# Patient Record
Sex: Female | Born: 1980 | Race: White | Hispanic: No | Marital: Single | Smoking: Current some day smoker
Health system: Southern US, Community
[De-identification: ages and names within clinical notes are randomized; demographics above are authoritative.]

## PROBLEM LIST (undated history)

## (undated) DIAGNOSIS — Z973 Presence of spectacles and contact lenses: Secondary | ICD-10-CM

## (undated) DIAGNOSIS — T753XXA Motion sickness, initial encounter: Secondary | ICD-10-CM

## (undated) HISTORY — PX: WISDOM TOOTH EXTRACTION: SHX21

---

## 2019-08-29 ENCOUNTER — Ambulatory Visit
Admission: EM | Admit: 2019-08-29 | Discharge: 2019-08-29 | Disposition: A | Discharge status: Home / Self Care | Payer: Self-pay | Attending: Emergency Medicine | Admitting: Emergency Medicine

## 2019-08-29 ENCOUNTER — Other Ambulatory Visit: Payer: Self-pay

## 2019-08-29 DIAGNOSIS — R634 Abnormal weight loss: Secondary | ICD-10-CM

## 2019-08-29 DIAGNOSIS — R112 Nausea with vomiting, unspecified: Secondary | ICD-10-CM

## 2019-08-29 LAB — URINALYSIS, COMPLETE (UACMP) WITH MICROSCOPIC
Glucose, UA: NEGATIVE mg/dL
Ketones, ur: 40 mg/dL — AB
Leukocytes,Ua: NEGATIVE
Nitrite: NEGATIVE
Protein, ur: NEGATIVE mg/dL
Specific Gravity, Urine: >1.03 — ABNORMAL HIGH (ref 1.005–1.030)
pH: 5.5 (ref 5.0–8.0)

## 2019-08-29 LAB — CBC WITH DIFFERENTIAL/PLATELET
Abs Immature Granulocytes: 0.04 10*3/uL (ref 0.00–0.07)
Basophils Absolute: 0.1 10*3/uL (ref 0.0–0.1)
Basophils Relative: 1 %
Eosinophils Absolute: 0.1 10*3/uL (ref 0.0–0.5)
Eosinophils Relative: 1 %
HCT: 41.9 % (ref 36.0–46.0)
Hemoglobin: 14.5 g/dL (ref 12.0–15.0)
Immature Granulocytes: 0 %
Lymphocytes Relative: 11 %
Lymphs Abs: 1.4 10*3/uL (ref 0.7–4.0)
MCH: 30.7 pg (ref 26.0–34.0)
MCHC: 34.6 g/dL (ref 30.0–36.0)
MCV: 88.6 fL (ref 80.0–100.0)
Monocytes Absolute: 0.6 10*3/uL (ref 0.1–1.0)
Monocytes Relative: 5 %
Neutro Abs: 10.2 10*3/uL — ABNORMAL HIGH (ref 1.7–7.7)
Neutrophils Relative %: 82 %
Platelets: 267 10*3/uL (ref 150–400)
RBC: 4.73 MIL/uL (ref 3.87–5.11)
RDW: 12.5 % (ref 11.5–15.5)
WBC: 12.3 10*3/uL — ABNORMAL HIGH (ref 4.0–10.5)
nRBC: 0 % (ref 0.0–0.2)

## 2019-08-29 LAB — COMPREHENSIVE METABOLIC PANEL
ALT: 23 U/L (ref 0–44)
AST: 27 U/L (ref 15–41)
Albumin: 4.6 g/dL (ref 3.5–5.0)
Alkaline Phosphatase: 57 U/L (ref 38–126)
Anion gap: 9 (ref 5–15)
BUN: 8 mg/dL (ref 6–20)
CO2: 25 mmol/L (ref 22–32)
Calcium: 9.3 mg/dL (ref 8.9–10.3)
Chloride: 103 mmol/L (ref 98–111)
Creatinine, Ser: 0.6 mg/dL (ref 0.44–1.00)
GFR calc Af Amer: >60 mL/min (ref >60)
GFR calc non Af Amer: >60 mL/min (ref >60)
Glucose, Bld: 102 mg/dL — ABNORMAL HIGH (ref 70–99)
Potassium: 3.3 mmol/L — ABNORMAL LOW (ref 3.5–5.1)
Sodium: 137 mmol/L (ref 135–145)
Total Bilirubin: 0.9 mg/dL (ref 0.3–1.2)
Total Protein: 7.9 g/dL (ref 6.5–8.1)

## 2019-08-29 LAB — HIV ANTIBODY (ROUTINE TESTING W REFLEX): HIV Screen 4th Generation wRfx: NONREACTIVE

## 2019-08-29 LAB — SEDIMENTATION RATE: Sed Rate: 8 mm/hr (ref 0–20)

## 2019-08-29 LAB — TSH: TSH: 1.674 u[IU]/mL (ref 0.350–4.500)

## 2019-08-29 LAB — LIPASE, BLOOD: Lipase: 28 U/L (ref 11–51)

## 2019-08-29 MED ORDER — ONDANSETRON 8 MG PO TBDP: ORAL_TABLET | ORAL | 0 refills | Status: DC

## 2019-08-29 MED ORDER — ONDANSETRON 8 MG PO TBDP
8.0000 mg | ORAL_TABLET | Freq: Once | ORAL | Status: AC
  Administered 2019-08-29: 8 mg via ORAL

## 2019-08-29 NOTE — ED Provider Notes (Addendum)
HPI  SUBJECTIVE:  Denise Carr is a 38 y.o. female who presents with 3 episodes of nonbilious nonbloody emesis starting yesterday.  She reports constant nausea.  States symptoms started shortly after lunch, which was chicken.  Nobody else got sick from lunch.  She reports left-sided abdominal/flank pain described as mild cramps before vomiting, and that resolves afterwards.  She has not had any further abdominal pain.  She states that she is unable to tolerate any p.o. whatsoever.  She tried Tylenol 250 mg, states that she took 10 pills total yesterday and is wondering if this is causing her symptoms.  No alleviating factors.  Nausea and vomiting is worse with eating, drinking, walking around.  She denies fevers, nasal congestion, sore throat, cough, shortness of breath, loss of sense of taste or smell, diarrhea, exposure to Covid.  She does not take any medications on a regular basis.  No new foods, raw or undercooked foods, questionable leftovers, contacts with similar symptoms.  Had a normal bowel movement this morning.  She denies change in urine output, urinary complaints.  Past medical history negative for diabetes, hypertension, cancer, abdominal surgeries, alcohol use, pancreatitis, gallbladder disease.  LMP: Now.  Denies the possibility of being pregnant.  She reports never having nausea and vomiting with menses before.  Family history significant for mother with celiac disease, brother and sister with gallbladder disease.  PMD: None.  She also reports a 70 pound unintentional weight loss with night sweats since June.  Reports decreased appetite.  She denies any risk factors for HIV including IV drug use, intercourse with HIV positive individual, blood transfusion.  What brings her in today is the acute nausea, vomiting.   History reviewed. No pertinent past medical history.  Past Surgical History:  Procedure Laterality Date  . NO PAST SURGERIES      Family History  Problem Relation Age  of Onset  . Celiac disease Mother   . Healthy Father     Social History   Tobacco Use  . Smoking status: Never Smoker  . Smokeless tobacco: Never Used  Substance Use Topics  . Alcohol use: Not Currently  . Drug use: Yes    Types: Marijuana    Comment: nightly    No current facility-administered medications for this encounter.  Current Outpatient Medications:  .  ondansetron (ZOFRAN ODT) 8 MG disintegrating tablet, 1/2- 1 tablet q 8 hr prn nausea, vomiting, Disp: 20 tablet, Rfl: 0  No Known Allergies   ROS  As noted in HPI.   Physical Exam  BP 120/89 (BP Location: Left Arm)   Pulse (!) 59   Temp 98.2 F (36.8 C) (Oral)   Resp 16   Ht 5' 4"  (1.626 m)   Wt 68.5 kg   LMP 08/28/2019   SpO2 100%   BMI 25.92 kg/m   Constitutional: Well developed, well nourished, no acute distress Eyes:  EOMI, conjunctiva normal bilaterally HENT: Normocephalic, atraumatic,mucus membranes moist Respiratory: Normal inspiratory effort, lungs clear bilaterally.   Cardiovascular: Normal rate regular rhythm no murmurs rubs or gallops GI: Normal appearance, soft.  Mild lower quadrant tenderness with deep palpation bilaterally.  No rebound, guarding.  Active bowel sounds.  Negative Murphy, negative McBurney. Back: No CVAT skin: No rash, skin intact Musculoskeletal: no deformities Neurologic: Alert & oriented x 3, no focal neuro deficits Psychiatric: Speech and behavior appropriate   ED Course   Medications  ondansetron (ZOFRAN-ODT) disintegrating tablet 8 mg (8 mg Oral Given 08/29/19 0854)    Orders  Placed This Encounter  Procedures  . Lipase, blood    Standing Status:   Standing    Number of Occurrences:   1  . Comprehensive metabolic panel    Standing Status:   Standing    Number of Occurrences:   1  . CBC with Differential    Standing Status:   Standing    Number of Occurrences:   1  . Sedimentation rate    Standing Status:   Standing    Number of Occurrences:   1  .  Urinalysis, Complete w Microscopic    Standing Status:   Standing    Number of Occurrences:   1  . Ambulatory referral to Gastroenterology    Referral Priority:   Urgent    Referral Type:   Consultation    Referral Reason:   Specialty Services Required    Referred to Provider:   Lucilla Lame, MD    Number of Visits Requested:   1    No results found for this or any previous visit (from the past 24 hour(s)). No results found.  ED Clinical Impression  1. Non-intractable vomiting with nausea, unspecified vomiting type   2. Unintentional weight loss      ED Assessment/Plan  Doubt Tylenol poisoning, calculated dose that she took yesterday 250 mg x 10 pills=2500 mg.  Could be food poisoning, gastritis, viral illness.  Her abdomen is benign.  Doubt perforation, pancreatitis, cholecystitis, appendicitis, obstruction.  CBC with mild leukocytosis, elevated neutrophils.  UA contaminated.  Will not send off for culture.  She has no nitrites or esterase.  She has some ketones, but she has also been unable to eat or drink since yesterday.  Small hematuria, small bilirubin.  Patient was given 8 mg of Zofran and Pedialyte.  She is tolerating p.o. prior to discharge.  States that she feels significantly better.  Her abdomen is benign.  Malignancy high in the differential with the unintentional weight loss, night sweats.  Will get basic labs eluding ESR TSH UA CMP, lipase, CBC, HIV. will call her at [phone] if these are abnormal.  Will refer to Dr. Allen Norris, GI for further work-up.  Will also provide primary care list.  ESR TSH, CMP, lipase, HIV pending.  Discussed labs, MDM, treatment plan, and plan for follow-up with patient.. patient agrees with plan.   08/31/2019 1444.  Additional labs reviewed.  Mild hypokalemia, but this is to be expected from the vomiting she reports.  Expect that this will resolve with some electrolyte containing fluids and potassium rich foods.  Sed rate, lipase, TSH normal.   HIV negative.  Discussed largely normal labs with patient.  She states that she is feeling much better.  She has an appointment with GI scheduled on 12/29.   Meds ordered this encounter  Medications  . ondansetron (ZOFRAN-ODT) disintegrating tablet 8 mg  . ondansetron (ZOFRAN ODT) 8 MG disintegrating tablet    Sig: 1/2- 1 tablet q 8 hr prn nausea, vomiting    Dispense:  20 tablet    Refill:  0    *This clinic note was created using Lobby[employer]. Therefore, there may be occasional mistakes despite careful proofreading.   ?    Melynda Ripple, MD 08/29/19 1009    Melynda Ripple, MD 08/31/19 1429

## 2019-08-29 NOTE — Discharge Instructions (Addendum)
Take the Zofran as needed for nausea and vomiting.  It May cause constipation.  Push extra electrolyte containing fluids such as Pedialyte or Gatorade until your urine is clear.  I will contact you only if your labs come back abnormal.  It may take several days for some of them to come back.  Follow-up with Dr. Allen Norris, gastroenterologist as soon as you can.  Here is a list of primary care providers who are taking new patients:  Dr. Otilio Miu, Dr. Adline Potter [address] [place] [address] [place] [address]  [phone]  [facility] [address] [place], [place] [address] [phone]  [facility]  [phone] [place], [place] [address]  Here are clinics/ other resources who will see you if you do not have insurance. Some have certain criteria that you must meet. Call them and find out what they are:  [facility]: [address] [place], [place] [address] Phone: [phone] Hours: First and Third Saturdays of each Month, 9 a.m. - 1 p.m.  [facility]: [address] [place], [place], [place] [address] Phone: [phone] Hours: Tuesday, 4 p.m. - 8 p.m. Thursday, 1 p.m. - 8 p.m. Wednesday, 9 a.m. - [facility] [address] [place], [place] [address] Phone: [phone] Pharmacy Phone Number: [phone] Dental Phone Number: [phone] [place] [facility]: [phone]  Dental Hours: Monday - Thursday, 8 a.m. - 6 p.m.  [facility] [address] [place], [place] [address] Phone: [phone] Pharmacy Phone Number: [phone] [facility] Insurance Help: [phone]  [facility] [address] [place], [place] [address] Phone: [phone] Pharmacy Phone Number: [phone] [facility] Insurance Help: [phone]  [facility] [address] [place], [place] [address] Phone: [phone] [facility] Insurance Help: [phone]   [address] [place], [place] [address] Phone: [phone]  Go to www.goodrx.com to look up your medications. This will give you a list of where you can find your prescriptions at the most affordable prices. Or ask the pharmacist what the cash price is, or if they have any other discount programs available to help make your medication more affordable. This can be less expensive than what you would pay with insurance.

## 2019-08-29 NOTE — ED Triage Notes (Signed)
Patient states that she started loosing weight in June. Patient states that she is down 70lbs but has not been trying to lose weight. Patient reports that she started her menstrual cycle yesterday and thinks this may be what is causing her symptoms today. Patient reports that she is having nausea, emesis- (twice yesterday and once this morning-- states that she has only been having liquid emesis). Patient states that she has been tired and this started with the weight loss.

## 2019-09-18 ENCOUNTER — Encounter: Payer: Self-pay | Admitting: Gastroenterology

## 2019-09-18 ENCOUNTER — Other Ambulatory Visit: Payer: Self-pay

## 2019-09-18 ENCOUNTER — Ambulatory Visit: Payer: Self-pay | Admitting: Gastroenterology

## 2019-09-18 VITALS — BP 121/71 | HR 81 | Temp 98.0°F | Ht 64.0 in | Wt 155.0 lb

## 2019-09-18 DIAGNOSIS — R634 Abnormal weight loss: Secondary | ICD-10-CM

## 2019-09-18 DIAGNOSIS — R6881 Early satiety: Secondary | ICD-10-CM

## 2019-09-18 NOTE — Progress Notes (Signed)
Gastroenterology Consultation  Referring Provider:     Melynda Ripple, MD Primary Care Physician:  Patient, No Pcp Per Primary Gastroenterologist:  Dr. Allen Norris     Reason for Consultation:     Weight loss with nausea vomiting        HPI:   Denise Carr is a 38 y.o. y/o female referred for consultation & management of weight loss with nausea vomiting by Dr. Patient, No Pcp Per.  This patient was in the emergency room back on 9 December for nausea, vomiting that had been going on for 1 day prior to going to the emergency room.  The patient had noted to the emergency room doctor that she had lost a significant amount of weight.  The patient had reported losing 70 pounds in total.  The patient was reporting left-sided abdominal pain and flank pain.  The patient's hemoglobin was normal but she had a slightly elevated white cell count of 12.3.  The patient's liver function tests were normal. The patient reports that she has some loose bowel movements during the summer but nothing recently.  She also states that her father had colon polyps when he was much older and her mother has celiac sprue.  The patient endorses the feeling of feeling full shortly after she eats.  There is no sign of any overt blood loss that she can report.  She also denies any abdominal pain.  Although she is pleased that she has lost weight she is concerned because she did not want any further weight.  History reviewed. No pertinent past medical history.  Past Surgical History:  Procedure Laterality Date  . NO PAST SURGERIES      Prior to Admission medications   Medication Sig Start Date End Date Taking? Authorizing Provider  ondansetron (ZOFRAN ODT) 8 MG disintegrating tablet 1/2- 1 tablet q 8 hr prn nausea, vomiting 08/29/19   Melynda Ripple, MD    Family History  Problem Relation Age of Onset  . Celiac disease Mother   . Healthy Father      Social History   Tobacco Use  . Smoking status: Never Smoker  .  Smokeless tobacco: Never Used  Substance Use Topics  . Alcohol use: Not Currently  . Drug use: Yes    Types: Marijuana    Comment: nightly    Allergies as of 09/18/2019  . (No Known Allergies)    Review of Systems:    All systems reviewed and negative except where noted in HPI.   Physical Exam:  BP 121/71   Pulse 81   Temp 98 F (36.7 C) (Oral)   Ht 5\' 4"  (1.626 m)   Wt 155 lb (70.3 kg)   LMP 08/28/2019   BMI 26.61 kg/m  Patient's last menstrual period was 08/28/2019. General:   Alert,  Well-developed, well-nourished, pleasant and cooperative in NAD Head:  Normocephalic and atraumatic. Eyes:  Sclera clear, no icterus.   Conjunctiva pink. Ears:  Normal auditory acuity. Neck:  Supple; no masses or thyromegaly. Lungs:  Respirations even and unlabored.  Clear throughout to auscultation.   No wheezes, crackles, or rhonchi. No acute distress. Heart:  Regular rate and rhythm; no murmurs, clicks, rubs, or gallops. Abdomen:  Normal bowel sounds.  No bruits.  Soft, non-tender and non-distended without masses, hepatosplenomegaly or hernias noted.  No guarding or rebound tenderness.  Negative Carnett sign.   Rectal:  Deferred.  Msk:  Symmetrical without gross deformities.  Good, equal movement & strength bilaterally. Pulses:  Normal pulses noted. Extremities:  No clubbing or edema.  No cyanosis. Neurologic:  Alert and oriented x3;  grossly normal neurologically. Skin:  Intact without significant lesions or rashes.  No jaundice. Lymph Nodes:  No significant cervical adenopathy. Psych:  Alert and cooperative. Normal mood and affect.  Imaging Studies: No results found.  Assessment and Plan:   [name] is a 38 y.o. y/o female who comes in today with a history of abdominal pain that she attributes to taking too much Tylenol and the pain has subsequently disappeared without any recurrence of the pain.  She also had noted that it was after taking many Tylenol over a 24-hour period.   She now reports early satiety and a 70 pound weight loss without trying.  The patient has been explained the possibility of a malabsorptive syndrome although she is not having diarrhea versus a possible neoplasm.  The patient has been recommended to undergo an EGD and colonoscopy.  The patient states that she would like to discuss it with her family and if she decides to proceed then she will contact us to set up the EGD and colonoscopy.  The patient has been explained the plan and agrees with it    Midge Minium, MD. Clementeen Graham    Note: This dictation was prepared with Dragon dictation along with smaller phrase technology. Any transcriptional errors that result from this process are unintentional.

## 2019-10-15 ENCOUNTER — Telehealth: Payer: Self-pay | Admitting: Gastroenterology

## 2019-10-15 NOTE — Telephone Encounter (Signed)
Pt is calling  She has been trying to reach  Ginger for a month now to schedule a EGD and a colonoscopy please call pt ASAP to set this up.

## 2019-10-15 NOTE — Telephone Encounter (Signed)
This is the first message I have received from this pt to schedule. LVM for pt to return my call.

## 2019-10-16 ENCOUNTER — Other Ambulatory Visit: Payer: Self-pay

## 2019-10-16 DIAGNOSIS — R6881 Early satiety: Secondary | ICD-10-CM

## 2019-10-16 DIAGNOSIS — R634 Abnormal weight loss: Secondary | ICD-10-CM

## 2019-11-07 ENCOUNTER — Other Ambulatory Visit: Payer: Self-pay

## 2019-11-07 ENCOUNTER — Encounter: Payer: Self-pay | Admitting: Gastroenterology

## 2019-11-09 ENCOUNTER — Other Ambulatory Visit: Payer: Self-pay

## 2019-11-09 ENCOUNTER — Other Ambulatory Visit
Admission: RE | Admit: 2019-11-09 | Discharge: 2019-11-09 | Disposition: A | Discharge status: Home / Self Care | Payer: Medicaid Other | Source: Ambulatory Visit | Attending: Gastroenterology | Admitting: Gastroenterology

## 2019-11-09 DIAGNOSIS — Z01812 Encounter for preprocedural laboratory examination: Secondary | ICD-10-CM | POA: Diagnosis present

## 2019-11-09 DIAGNOSIS — Z20822 Contact with and (suspected) exposure to covid-19: Secondary | ICD-10-CM | POA: Insufficient documentation

## 2019-11-09 LAB — SARS CORONAVIRUS 2 (TAT 6-24 HRS): SARS Coronavirus 2: NEGATIVE

## 2019-11-09 MED ORDER — SUPREP BOWEL PREP KIT 17.5-3.13-1.6 GM/177ML PO SOLN: 1.0000 | ORAL | 0 refills | Status: DC

## 2019-11-12 NOTE — Discharge Instructions (Signed)
General Anesthesia, Adult, Care After This sheet gives you information about how to care for yourself after your procedure. Your health care provider may also give you more specific instructions. If you have problems or questions, contact your health care provider. What can I expect after the procedure? After the procedure, the following side effects are common:  Pain or discomfort at the IV site.  Nausea.  Vomiting.  Sore throat.  Trouble concentrating.  Feeling cold or chills.  Weak or tired.  Sleepiness and fatigue.  Soreness and body aches. These side effects can affect parts of the body that were not involved in surgery. Follow these instructions at home:  For at least 24 hours after the procedure:  Have a responsible adult stay with you. It is important to have someone help care for you until you are awake and alert.  Rest as needed.  Do not: ? Participate in activities in which you could fall or become injured. ? Drive. ? Use heavy machinery. ? Drink alcohol. ? Take sleeping pills or medicines that cause drowsiness. ? Make important decisions or sign legal documents. ? Take care of children on your own. Eating and drinking  Follow any instructions from your health care provider about eating or drinking restrictions.  When you feel hungry, start by eating small amounts of foods that are soft and easy to digest (bland), such as toast. Gradually return to your regular diet.  Drink enough fluid to keep your urine pale yellow.  If you vomit, rehydrate by drinking water, juice, or clear broth. General instructions  If you have sleep apnea, surgery and certain medicines can increase your risk for breathing problems. Follow instructions from your health care provider about wearing your sleep device: ? Anytime you are sleeping, including during daytime naps. ? While taking prescription pain medicines, sleeping medicines, or medicines that make you drowsy.  Return to  your normal activities as told by your health care provider. Ask your health care provider what activities are safe for you.  Take over-the-counter and prescription medicines only as told by your health care provider.  If you smoke, do not smoke without supervision.  Keep all follow-up visits as told by your health care provider. This is important. Contact a health care provider if:  You have nausea or vomiting that does not get better with medicine.  You cannot eat or drink without vomiting.  You have pain that does not get better with medicine.  You are unable to pass urine.  You develop a skin rash.  You have a fever.  You have redness around your IV site that gets worse. Get help right away if:  You have difficulty breathing.  You have chest pain.  You have blood in your urine or stool, or you vomit blood. Summary  After the procedure, it is common to have a sore throat or nausea. It is also common to feel tired.  Have a responsible adult stay with you for the first 24 hours after general anesthesia. It is important to have someone help care for you until you are awake and alert.  When you feel hungry, start by eating small amounts of foods that are soft and easy to digest (bland), such as toast. Gradually return to your regular diet.  Drink enough fluid to keep your urine pale yellow.  Return to your normal activities as told by your health care provider. Ask your health care provider what activities are safe for you. This information is not   intended to replace advice given to you by your health care provider. Make sure you discuss any questions you have with your health care provider. Document Revised: 09/09/2017 Document Reviewed: 04/22/2017 Elsevier Patient Education  2020 Elsevier Inc.  

## 2019-11-13 ENCOUNTER — Ambulatory Visit
Admission: RE | Admit: 2019-11-13 | Discharge: 2019-11-13 | Disposition: A | Discharge status: Home / Self Care | Payer: Self-pay | Source: Ambulatory Visit | Attending: Gastroenterology | Admitting: Gastroenterology

## 2019-11-13 ENCOUNTER — Other Ambulatory Visit: Payer: Self-pay

## 2019-11-13 ENCOUNTER — Encounter: Payer: Self-pay | Admitting: Gastroenterology

## 2019-11-13 ENCOUNTER — Ambulatory Visit: Payer: Self-pay | Admitting: Anesthesiology

## 2019-11-13 ENCOUNTER — Encounter
Admission: RE | Disposition: A | Discharge status: Home / Self Care | Payer: Self-pay | Source: Ambulatory Visit | Attending: Gastroenterology

## 2019-11-13 DIAGNOSIS — Z6825 Body mass index (BMI) 25.0-25.9, adult: Secondary | ICD-10-CM | POA: Insufficient documentation

## 2019-11-13 DIAGNOSIS — R6881 Early satiety: Secondary | ICD-10-CM

## 2019-11-13 DIAGNOSIS — R634 Abnormal weight loss: Secondary | ICD-10-CM

## 2019-11-13 DIAGNOSIS — K640 First degree hemorrhoids: Secondary | ICD-10-CM | POA: Insufficient documentation

## 2019-11-13 DIAGNOSIS — Z8379 Family history of other diseases of the digestive system: Secondary | ICD-10-CM | POA: Insufficient documentation

## 2019-11-13 DIAGNOSIS — Z79899 Other long term (current) drug therapy: Secondary | ICD-10-CM | POA: Insufficient documentation

## 2019-11-13 HISTORY — DX: Presence of spectacles and contact lenses: Z97.3

## 2019-11-13 HISTORY — PX: COLONOSCOPY WITH PROPOFOL: SHX5780

## 2019-11-13 HISTORY — DX: Motion sickness, initial encounter: T75.3XXA

## 2019-11-13 HISTORY — PX: ESOPHAGOGASTRODUODENOSCOPY (EGD) WITH PROPOFOL: SHX5813

## 2019-11-13 LAB — POCT PREGNANCY, URINE: Preg Test, Ur: NEGATIVE

## 2019-11-13 SURGERY — COLONOSCOPY WITH PROPOFOL
Anesthesia: General | Site: Rectum

## 2019-11-13 MED ORDER — LACTATED RINGERS IV SOLN: 100.0000 mL/h | INTRAVENOUS | Status: DC

## 2019-11-13 MED ORDER — LACTATED RINGERS IV SOLN: INTRAVENOUS | Status: DC

## 2019-11-13 MED ORDER — LIDOCAINE HCL (CARDIAC) PF 100 MG/5ML IV SOSY
PREFILLED_SYRINGE | INTRAVENOUS | Status: DC | PRN
  Administered 2019-11-13: 30 mg via INTRAVENOUS

## 2019-11-13 MED ORDER — GLYCOPYRROLATE 0.2 MG/ML IJ SOLN
INTRAMUSCULAR | Status: DC | PRN
  Administered 2019-11-13: .1 mg via INTRAVENOUS

## 2019-11-13 MED ORDER — PROPOFOL 10 MG/ML IV BOLUS
INTRAVENOUS | Status: DC | PRN
  Administered 2019-11-13 (×3): 50 mg via INTRAVENOUS
  Administered 2019-11-13: 100 mg via INTRAVENOUS
  Administered 2019-11-13 (×4): 50 mg via INTRAVENOUS

## 2019-11-13 MED ORDER — STERILE WATER FOR IRRIGATION IR SOLN
Status: DC | PRN
  Administered 2019-11-13: 09:00:00 50 mL

## 2019-11-13 SURGICAL SUPPLY — 7 items
BLOCK BITE 60FR ADLT L/F GRN (MISCELLANEOUS) ×4 IMPLANT
CANISTER SUCT 1200ML W/VALVE (MISCELLANEOUS) ×4 IMPLANT
FORCEPS BIOP RAD 4 LRG CAP 4 (CUTTING FORCEPS) ×2 IMPLANT
GOWN CVR UNV OPN BCK APRN NK (MISCELLANEOUS) ×4 IMPLANT
GOWN ISOL THUMB LOOP REG UNIV (MISCELLANEOUS) ×4
KIT ENDO PROCEDURE OLY (KITS) ×4 IMPLANT
WATER STERILE IRR 250ML POUR (IV SOLUTION) ×4 IMPLANT

## 2019-11-13 NOTE — Anesthesia Procedure Notes (Signed)
Date/Time: 11/13/2019 8:24 AM Performed by: Maree Krabbe, CRNA Pre-anesthesia Checklist: Patient identified, Emergency Drugs available, Suction available, Timeout performed and Patient being monitored Patient Re-evaluated:Patient Re-evaluated prior to induction Oxygen Delivery Method: Nasal cannula Placement Confirmation: positive ETCO2

## 2019-11-13 NOTE — Op Note (Signed)
[facility] Gastroenterology Patient Name: Denise Carr Procedure Date: 11/13/2019 7:58 AM MRN: [id number] Account #: [id number] Date of Birth: [birth year] Admit Type: Outpatient Age: 39 Room: [facility] OR ROOM 01 Gender: Female Note Status: Finalized Procedure:             Upper GI endoscopy Indications:           Early satiety Providers:             Lucilla Lame MD, MD Medicines:             Propofol per Anesthesia Complications:         No immediate complications. Procedure:             Pre-Anesthesia Assessment:                        - Prior to the procedure, a History and Physical was                         performed, and patient medications and allergies were                         reviewed. The patient's tolerance of previous                         anesthesia was also reviewed. The risks and benefits                         of the procedure and the sedation options and risks                         were discussed with the patient. All questions were                         answered, and informed consent was obtained. Prior                         Anticoagulants: The patient has taken no previous                         anticoagulant or antiplatelet agents. ASA Grade                         Assessment: II - A patient with mild systemic disease.                         After reviewing the risks and benefits, the patient                         was deemed in satisfactory condition to undergo the                         procedure.                        After obtaining informed consent, the endoscope was                         passed under direct vision. Throughout the procedure,  the patient's blood pressure, pulse, and oxygen                         saturations were monitored continuously. The was                         introduced through the mouth, and advanced to the                         second part of duodenum. The upper GI  endoscopy was                         accomplished without difficulty. The patient tolerated                         the procedure well. Findings:      The examined esophagus was normal.      The entire examined stomach was normal. Biopsies were taken with a cold       forceps for histology. Biopsies were taken with a cold forceps for       histology.      The examined duodenum was normal. Biopsies were taken with a cold       forceps for histology. Impression:            - Normal esophagus.                        - Normal stomach. Biopsied.                        - Normal examined duodenum. Biopsied. Recommendation:        - Discharge patient to home.                        - Resume previous diet.                        - Continue present medications.                        - Await pathology results.                        - Perform a colonoscopy today. Procedure Code(s):     --- Professional ---                        [id number], Esophagogastroduodenoscopy, flexible,                         transoral; with biopsy, single or multiple Diagnosis Code(s):     --- Professional ---                        R68.81, Early satiety CPT copyright 2019 American Medical Association. All rights reserved. The codes documented in this report are preliminary and upon coder review may  be revised to meet current compliance requirements. Midge Minium MD, MD 11/13/2019 8:33:41 AM This report has been signed electronically. Number of Addenda: 0 Note Initiated On: 11/13/2019 7:58 AM Total Procedure Duration: 0 hours 3 minutes 59 seconds  Estimated Blood Loss:  Estimated blood loss: none.  [facility]

## 2019-11-13 NOTE — Transfer of Care (Signed)
Immediate Anesthesia Transfer of Care Note  Patient: Denise Carr  Procedure(s) Performed: COLONOSCOPY WITH PROPOFOL (N/A Rectum) ESOPHAGOGASTRODUODENOSCOPY (EGD) WITH BIOPSY (N/A Mouth)  Patient Location: PACU  Anesthesia Type: General  Level of Consciousness: awake, alert  and patient cooperative  Airway and Oxygen Therapy: Patient Spontanous Breathing and Patient connected to supplemental oxygen  Post-op Assessment: Post-op Vital signs reviewed, Patient's Cardiovascular Status Stable, Respiratory Function Stable, Patent Airway and No signs of Nausea or vomiting  Post-op Vital Signs: Reviewed and stable  Complications: No apparent anesthesia complications

## 2019-11-13 NOTE — H&P (Signed)
Lucilla Lame, MD [address] [place], [place] [address] Phone:[phone] Fax : [phone]  Primary Care Physician:  Patient, No Pcp Per Primary Gastroenterologist:  Dr. Allen Norris  Pre-Procedure History & Physical: HPI:  Denise Carr is a 39 y.o. female is here for an endoscopy and colonoscopy.   Past Medical History:  Diagnosis Date  . Motion sickness    roller coasters  . Wears contact lenses     Past Surgical History:  Procedure Laterality Date  . WISDOM TOOTH EXTRACTION      Prior to Admission medications   Medication Sig Start Date End Date Taking? Authorizing Provider  Na Sulfate-K Sulfate-Mg Sulf (SUPREP BOWEL PREP KIT) 17.5-3.13-1.6 GM/177ML SOLN Take 1 kit by mouth as directed. 11/09/19   Lucilla Lame, MD  ondansetron (ZOFRAN ODT) 8 MG disintegrating tablet 1/2- 1 tablet q 8 hr prn nausea, vomiting Patient not taking: Reported on 09/18/2019 08/29/19   Melynda Ripple, MD    Allergies as of 10/16/2019  . (No Known Allergies)    Family History  Problem Relation Age of Onset  . Celiac disease Mother   . Healthy Father     Social History   Socioeconomic History  . Marital status: Single    Spouse name: Not on file  . Number of children: Not on file  . Years of education: Not on file  . Highest education level: Not on file  Occupational History  . Not on file  Tobacco Use  . Smoking status: Never Smoker  . Smokeless tobacco: Never Used  Substance and Sexual Activity  . Alcohol use: Not Currently  . Drug use: Yes    Types: Marijuana    Comment: nightly  . Sexual activity: Not on file  Other Topics Concern  . Not on file  Social History Narrative  . Not on file   Social Determinants of Health   Financial Resource Strain:   . Difficulty of Paying Living Expenses: Not on file  Food Insecurity:   . Worried About Charity fundraiser in the Last Year: Not on file  . Ran Out of Food in the Last Year: Not on file  Transportation  Needs:   . Lack of Transportation (Medical): Not on file  . Lack of Transportation (Non-Medical): Not on file  Physical Activity:   . Days of Exercise per Week: Not on file  . Minutes of Exercise per Session: Not on file  Stress:   . Feeling of Stress : Not on file  Social Connections:   . Frequency of Communication with Friends and Family: Not on file  . Frequency of Social Gatherings with Friends and Family: Not on file  . Attends Religious Services: Not on file  . Active Member of Clubs or Organizations: Not on file  . Attends Archivist Meetings: Not on file  . Marital Status: Not on file  Intimate Partner Violence:   . Fear of Current or Ex-Partner: Not on file  . Emotionally Abused: Not on file  . Physically Abused: Not on file  . Sexually Abused: Not on file    Review of Systems: See HPI, otherwise negative ROS  Physical Exam: Ht 5' 4"  (1.626 m)   Wt 65.8 kg   LMP 10/28/2019 (Approximate)   BMI 24.89 kg/m  General:   Alert,  pleasant and cooperative in NAD Head:  Normocephalic and atraumatic. Neck:  Supple; no masses or thyromegaly. Lungs:  Clear throughout to auscultation.    Heart:  Regular rate and rhythm. Abdomen:  Soft, nontender and nondistended. Normal bowel sounds, without guarding, and without rebound.   Neurologic:  Alert and  oriented x4;  grossly normal neurologically.  Impression/Plan: Denise Carr is here for an endoscopy and colonoscopy to be performed for weight loss and early satiety.  Risks, benefits, limitations, and alternatives regarding  endoscopy and colonoscopy have been reviewed with the patient.  Questions have been answered.  All parties agreeable.   Lucilla Lame, MD  11/13/2019, 8:03 AM

## 2019-11-13 NOTE — Op Note (Signed)
[facility] Gastroenterology Patient Name: Denise Carr Procedure Date: 11/13/2019 7:56 AM MRN: [id number] Account #: [id number] Date of Birth: 04/25/1981 Admit Type: Outpatient Age: 39 Room: [facility] OR ROOM 01 Gender: Female Note Status: Finalized Procedure:             Colonoscopy Indications:           Weight loss Providers:             Midge Minium MD, MD Medicines:             Propofol per Anesthesia Complications:         No immediate complications. Procedure:             Pre-Anesthesia Assessment:                        - Prior to the procedure, a History and Physical was                         performed, and patient medications and allergies were                         reviewed. The patient's tolerance of previous                         anesthesia was also reviewed. The risks and benefits                         of the procedure and the sedation options and risks                         were discussed with the patient. All questions were                         answered, and informed consent was obtained. Prior                         Anticoagulants: The patient has taken no previous                         anticoagulant or antiplatelet agents. ASA Grade                         Assessment: II - A patient with mild systemic disease.                         After reviewing the risks and benefits, the patient                         was deemed in satisfactory condition to undergo the                         procedure.                        After obtaining informed consent, the colonoscope was                         passed under direct vision. Throughout the procedure,  the patient's blood pressure, pulse, and oxygen                         saturations were monitored continuously. The was                         introduced through the anus and advanced to the the                         terminal ileum. The colonoscopy was performed  without                         difficulty. The patient tolerated the procedure well.                         The quality of the bowel preparation was excellent. Findings:      The perianal and digital rectal examinations were normal.      Non-bleeding internal hemorrhoids were found during retroflexion. The       hemorrhoids were Grade I (internal hemorrhoids that do not prolapse).      The terminal ileum appeared normal. Impression:            - Non-bleeding internal hemorrhoids.                        - The examined portion of the ileum was normal.                        - No specimens collected. Recommendation:        - Discharge patient to home.                        - Resume previous diet.                        - Continue present medications. Procedure Code(s):     --- Professional ---                        [phone], Colonoscopy, flexible; diagnostic, including                         collection of specimen(s) by brushing or washing, when                         performed (separate procedure) Diagnosis Code(s):     --- Professional ---                        R63.4, Abnormal weight loss CPT copyright 2019 American Medical Association. All rights reserved. The codes documented in this report are preliminary and upon coder review may  be revised to meet current compliance requirements. Lucilla Lame MD, MD 11/13/2019 8:48:31 AM This report has been signed electronically. Number of Addenda: 0 Note Initiated On: 11/13/2019 7:56 AM Scope Withdrawal Time: 0 hours 7 minutes 38 seconds  Total Procedure Duration: 0 hours 11 minutes 7 seconds  Estimated Blood Loss:  Estimated blood loss: none. Estimated blood loss: none.      [facility]

## 2019-11-13 NOTE — Anesthesia Preprocedure Evaluation (Addendum)
Anesthesia Evaluation  Patient identified by MRN, date of birth, ID band Patient awake    Reviewed: NPO status   Airway Mallampati: II  TM Distance: >3 FB Neck ROM: full    Dental  (+) Chipped,    Pulmonary neg pulmonary ROS, Patient abstained from smoking.,    Pulmonary exam normal        Cardiovascular Exercise Tolerance: Good negative cardio ROS Normal cardiovascular exam     Neuro/Psych negative neurological ROS  negative psych ROS   GI/Hepatic negative GI ROS, Neg liver ROS,   Endo/Other  negative endocrine ROS  Renal/GU negative Renal ROS  negative genitourinary   Musculoskeletal   Abdominal   Peds  Hematology negative hematology ROS (+)   Anesthesia Other Findings Bmi= 26  Covid: NEG. HCG: NEG  Reproductive/Obstetrics negative OB ROS                            Anesthesia Physical Anesthesia Plan  ASA: I  Anesthesia Plan: General   Post-op Pain Management:    Induction:   PONV Risk Score and Plan: 2 and Propofol infusion and TIVA  Airway Management Planned:   Additional Equipment:   Intra-op Plan:   Post-operative Plan:   Informed Consent: I have reviewed the patients History and Physical, chart, labs and discussed the procedure including the risks, benefits and alternatives for the proposed anesthesia with the patient or authorized representative who has indicated his/her understanding and acceptance.       Plan Discussed with: CRNA  Anesthesia Plan Comments:         Anesthesia Quick Evaluation

## 2019-11-13 NOTE — Anesthesia Postprocedure Evaluation (Signed)
Anesthesia Post Note  Patient: Denise Carr  Procedure(s) Performed: COLONOSCOPY WITH PROPOFOL (N/A Rectum) ESOPHAGOGASTRODUODENOSCOPY (EGD) WITH BIOPSY (N/A Mouth)     Patient location during evaluation: PACU Anesthesia Type: General Level of consciousness: awake and alert Pain management: pain level controlled Vital Signs Assessment: post-procedure vital signs reviewed and stable Respiratory status: spontaneous breathing, nonlabored ventilation, respiratory function stable and patient connected to nasal cannula oxygen Cardiovascular status: blood pressure returned to baseline and stable Postop Assessment: no apparent nausea or vomiting Anesthetic complications: no    Orphia Mctigue

## 2019-11-14 ENCOUNTER — Encounter: Payer: Self-pay | Admitting: *Deleted

## 2019-11-15 LAB — SURGICAL PATHOLOGY

## 2019-11-21 ENCOUNTER — Encounter: Payer: Self-pay | Admitting: Gastroenterology

## 2020-04-28 ENCOUNTER — Other Ambulatory Visit: Payer: Self-pay

## 2020-04-28 ENCOUNTER — Ambulatory Visit
Admission: EM | Admit: 2020-04-28 | Discharge: 2020-04-28 | Disposition: A | Discharge status: Home / Self Care | Payer: Medicaid Other | Attending: Family Medicine | Admitting: Family Medicine

## 2020-04-28 ENCOUNTER — Ambulatory Visit (INDEPENDENT_AMBULATORY_CARE_PROVIDER_SITE_OTHER): Payer: Medicaid Other

## 2020-04-28 DIAGNOSIS — R0602 Shortness of breath: Secondary | ICD-10-CM | POA: Diagnosis present

## 2020-04-28 DIAGNOSIS — R5383 Other fatigue: Secondary | ICD-10-CM

## 2020-04-28 DIAGNOSIS — R6881 Early satiety: Secondary | ICD-10-CM | POA: Diagnosis not present

## 2020-04-28 DIAGNOSIS — F17210 Nicotine dependence, cigarettes, uncomplicated: Secondary | ICD-10-CM | POA: Diagnosis not present

## 2020-04-28 DIAGNOSIS — R200 Anesthesia of skin: Secondary | ICD-10-CM | POA: Insufficient documentation

## 2020-04-28 DIAGNOSIS — U071 COVID-19: Secondary | ICD-10-CM | POA: Diagnosis not present

## 2020-04-28 DIAGNOSIS — R202 Paresthesia of skin: Secondary | ICD-10-CM | POA: Insufficient documentation

## 2020-04-28 MED ORDER — ALBUTEROL SULFATE HFA 108 (90 BASE) MCG/ACT IN AERS: 1.0000 | INHALATION_SPRAY | Freq: Four times a day (QID) | RESPIRATORY_TRACT | 0 refills | Status: AC | PRN

## 2020-04-28 NOTE — ED Triage Notes (Signed)
Pt states she drank a LIT pre-workout energy drink before going to the gym today. After getting home after her workout, she smoked a cigarette and then felt her face go numb, hands tingly and couldn't catch her breath. States she was exposed to COVID last week and was very sick.

## 2020-04-28 NOTE — ED Provider Notes (Signed)
[facility]    CSN: [id number] Arrival date & time: 04/28/20  1301  History   Chief Complaint Chief Complaint  Patient presents with  . Shortness of Breath   HPI  39 year old female presents with SOB.  Reports that she was sick last week. Reports that she was very fatigued. Patient reports that she has been feeling better. Went to the gym today. Prior to going to the gym, she drank a pre-workout drink.  Patient reports that she subsequently developed shortness of breath.  She also reports hand numbness and tingling.  Feels like she cannot get a good deep breath.  Patient is concerned that she is "going to die".  No fever.  Patient states that she has had a cough.  No other reported symptoms.  No relieving factors.  No other complaints at this time.  Of note, patient is in no acute distress.  She is resting comfortably does not appear to have any increased work of breathing.  Past Medical History:  Diagnosis Date  . Motion sickness    roller coasters  . Wears contact lenses     Patient Active Problem List   Diagnosis Date Noted  . Loss of weight   . Early satiety     Past Surgical History:  Procedure Laterality Date  . COLONOSCOPY WITH PROPOFOL N/A 11/13/2019   Procedure: COLONOSCOPY WITH PROPOFOL;  Surgeon: Midge Minium, MD;  Location: [facility];  Service: Endoscopy;  Laterality: N/A;  PRIORITY 3  . ESOPHAGOGASTRODUODENOSCOPY (EGD) WITH PROPOFOL N/A 11/13/2019   Procedure: ESOPHAGOGASTRODUODENOSCOPY (EGD) WITH BIOPSY;  Surgeon: Midge Minium, MD;  Location: [facility];  Service: Endoscopy;  Laterality: N/A;  Requests early as possible PRIORITY 3  . WISDOM TOOTH EXTRACTION      OB History   No obstetric history on file.      Home Medications    Prior to Admission medications   Medication Sig Start Date End Date Taking? Authorizing Provider  albuterol (VENTOLIN HFA) 108 (90 Base) MCG/ACT inhaler Inhale 1-2 puffs into the lungs every 6  (six) hours as needed for wheezing or shortness of breath. 04/28/20   Tommie Sams, DO    Family History Family History  Problem Relation Age of Onset  . Celiac disease Mother   . Healthy Father     Social History Social History   Tobacco Use  . Smoking status: Current Some Day Smoker    Types: Cigarettes  . Smokeless tobacco: Never Used  Vaping Use  . Vaping Use: Never used  Substance Use Topics  . Alcohol use: Not Currently  . Drug use: Yes    Types: Marijuana    Comment: nightly     Allergies   Patient has no known allergies.   Review of Systems Review of Systems  Constitutional: Negative for fever.  Respiratory: Positive for cough and shortness of breath.   Neurological:       Numbness/tingling.    Physical Exam Triage Vital Signs ED Triage Vitals  Enc Vitals Group     BP 04/28/20 1311 103/73     Pulse Rate 04/28/20 1311 78     Resp 04/28/20 1311 20     Temp 04/28/20 1311 97.9 F (36.6 C)     Temp Source 04/28/20 1311 Oral     SpO2 04/28/20 1311 100 %     Weight 04/28/20 1312 140 lb (63.5 kg)     Height 04/28/20 1312 5\' 3"  (1.6 m)  Head Circumference --      Peak Flow --      Pain Score 04/28/20 1312 0     Pain Loc --      Pain Edu? --      Excl. in GC? --    Updated Vital Signs BP 103/73 (BP Location: Left Arm)   Pulse 78   Temp 97.9 F (36.6 C) (Oral)   Resp 20   Ht 5\' 3"  (1.6 m)   Wt 63.5 kg   LMP 04/21/2020   SpO2 100%   BMI 24.80 kg/m   Visual Acuity Right Eye Distance:   Left Eye Distance:   Bilateral Distance:    Right Eye Near:   Left Eye Near:    Bilateral Near:     Physical Exam Vitals and nursing note reviewed.  Constitutional:      General: She is not in acute distress.    Appearance: Normal appearance. She is not ill-appearing.  HENT:     Head: Normocephalic and atraumatic.  Eyes:     General:        Right eye: No discharge.        Left eye: No discharge.     Conjunctiva/sclera: Conjunctivae normal.    Cardiovascular:     Rate and Rhythm: Normal rate and regular rhythm.  Pulmonary:     Effort: Pulmonary effort is normal.     Breath sounds: Normal breath sounds. No wheezing, rhonchi or rales.  Neurological:     Mental Status: She is alert.  Psychiatric:        Mood and Affect: Mood normal.        Behavior: Behavior normal.    UC Treatments / Results  Labs (all labs ordered are listed, but only abnormal results are displayed) Labs Reviewed  SARS CORONAVIRUS 2 (TAT 6-24 HRS)    EKG   Radiology DG Chest 2 View  Result Date: 04/28/2020 CLINICAL DATA:  Shortness of breath EXAM: CHEST - 2 VIEW COMPARISON:  None. FINDINGS: The heart size and mediastinal contours are within normal limits. Both lungs are clear. The visualized skeletal structures are unremarkable. IMPRESSION: No active cardiopulmonary disease. Electronically Signed   By: 06/28/2020 M.D.   On: 04/28/2020 13:36    Procedures Procedures (including critical care time)  Medications Ordered in UC Medications - No data to display  Initial Impression / Assessment and Plan / UC Course  I have reviewed the triage vital signs and the nursing notes.  Pertinent labs & imaging results that were available during my care of the patient were reviewed by me and considered in my medical decision making (see chart for details).    39 year old female presents with shortness of breath and fatigue.  Her vital signs are stable.  She is well-appearing.  Chest x-ray was obtained and independently reviewed by me.  Interpretation: No acute cardiopulmonary findings.  No evidence of pneumonia.  Albuterol as needed.  Patient needs to stop smoking.  Supportive care.  Final Clinical Impressions(s) / UC Diagnoses   Final diagnoses:  SOB (shortness of breath)  Fatigue, unspecified type     Discharge Instructions     Covid test result will be available tomorrow.  Your chest x-ray was normal.  Vital signs were normal.  Exam was  unrevealing.  Rest.  Medication as needed.  Take care  Dr. 30   ED Prescriptions    Medication Sig Dispense Auth. Provider   albuterol (VENTOLIN HFA) 108 (90 Base)  MCG/ACT inhaler Inhale 1-2 puffs into the lungs every 6 (six) hours as needed for wheezing or shortness of breath. 18 g Tommie Sams, DO     PDMP not reviewed this encounter.   Tommie Sams, [place] 04/28/20 1517

## 2020-04-28 NOTE — Discharge Instructions (Signed)
Covid test result will be available tomorrow.  Your chest x-ray was normal.  Vital signs were normal.  Exam was unrevealing.  Rest.  Medication as needed.  Take care  Dr. Adriana Simas

## 2020-04-29 LAB — SARS CORONAVIRUS 2 (TAT 6-24 HRS): SARS Coronavirus 2: POSITIVE — AB

## 2021-10-08 ENCOUNTER — Other Ambulatory Visit: Payer: Self-pay | Admitting: Obstetrics and Gynecology

## 2021-10-08 ENCOUNTER — Ambulatory Visit
Admission: RE | Admit: 2021-10-08 | Discharge: 2021-10-08 | Disposition: A | Discharge status: Home / Self Care | Payer: Medicaid Other | Source: Ambulatory Visit | Attending: Obstetrics and Gynecology | Admitting: Obstetrics and Gynecology

## 2021-10-08 DIAGNOSIS — O200 Threatened abortion: Secondary | ICD-10-CM | POA: Diagnosis not present

## 2021-10-12 ENCOUNTER — Other Ambulatory Visit
Admission: RE | Admit: 2021-10-12 | Discharge: 2021-10-12 | Disposition: A | Discharge status: Home / Self Care | Payer: Medicaid Other | Source: Ambulatory Visit | Attending: Obstetrics and Gynecology | Admitting: Obstetrics and Gynecology

## 2021-10-12 DIAGNOSIS — O3680X0 Pregnancy with inconclusive fetal viability, not applicable or unspecified: Secondary | ICD-10-CM | POA: Diagnosis present

## 2021-10-12 LAB — HCG, QUANTITATIVE, PREGNANCY: hCG, Beta Chain, Quant, S: 44580 m[IU]/mL — ABNORMAL HIGH (ref <5)

## 2021-10-16 ENCOUNTER — Ambulatory Visit
Admission: RE | Admit: 2021-10-16 | Discharge: 2021-10-16 | Disposition: A | Discharge status: Home / Self Care | Payer: Medicaid Other | Source: Ambulatory Visit | Attending: Obstetrics and Gynecology | Admitting: Obstetrics and Gynecology

## 2021-10-16 ENCOUNTER — Other Ambulatory Visit: Payer: Self-pay | Admitting: Obstetrics and Gynecology

## 2021-10-16 ENCOUNTER — Other Ambulatory Visit: Payer: Self-pay

## 2021-10-16 DIAGNOSIS — O200 Threatened abortion: Secondary | ICD-10-CM

## 2023-07-18 IMAGING — US US OB COMP LESS 14 WK
1 series · 14 of 28 positions shown · non-contrast
Comparison: 10/08/2021

CLINICAL DATA: Threatened abortion

EXAM:
OBSTETRIC <14 WK ULTRASOUND
TECHNIQUE: Transabdominal ultrasound was performed for evaluation of the
gestation as well as the maternal uterus and adnexal regions.

[Series 1: us ob comp less 14 wk · 0.12mm/px · 14 of 31 slices shown]
[im 2/31]
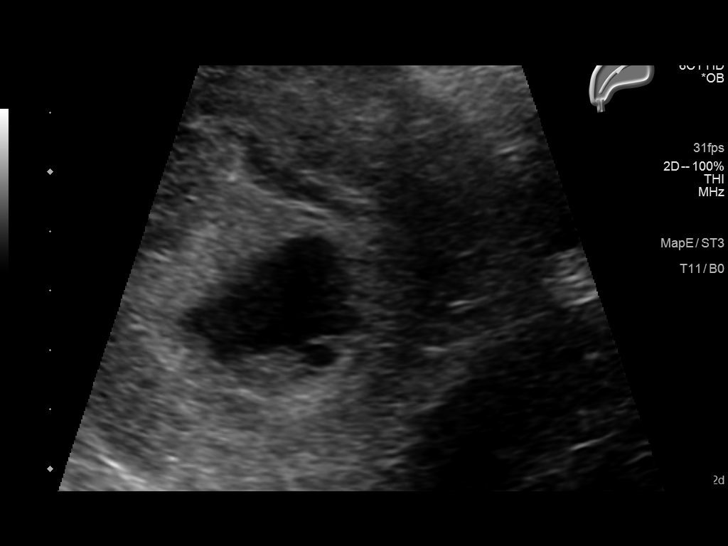
[im 4/31]
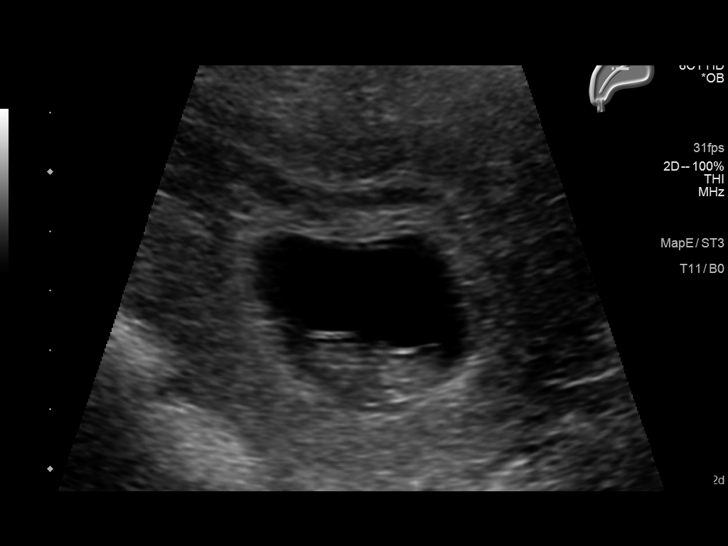
[im 6/31]
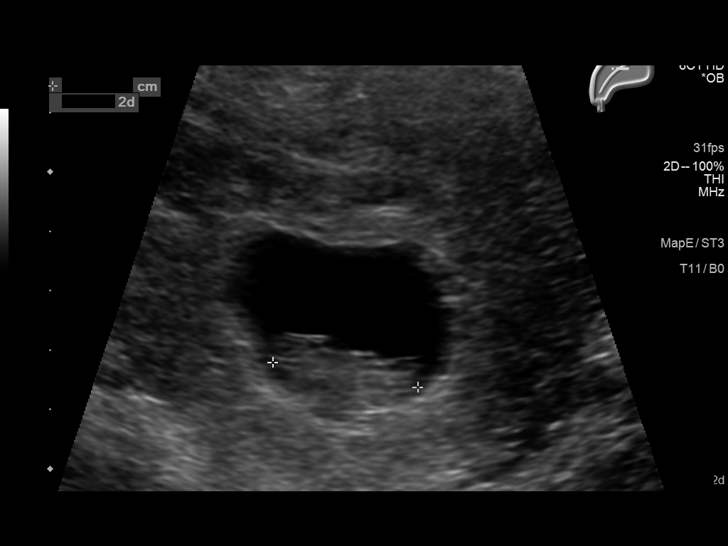
[im 8/31]
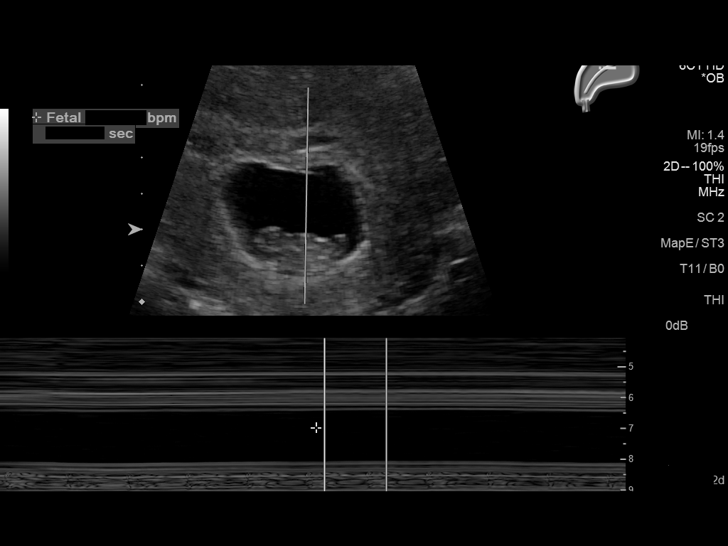
[im 11/31]
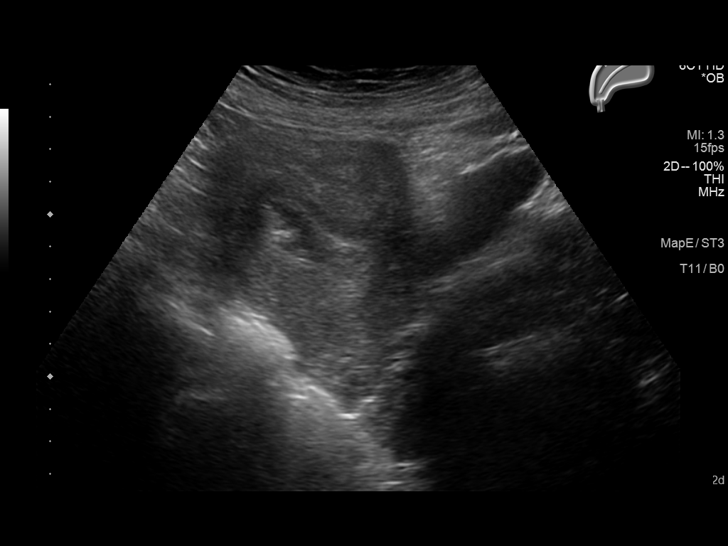
[im 13/31]
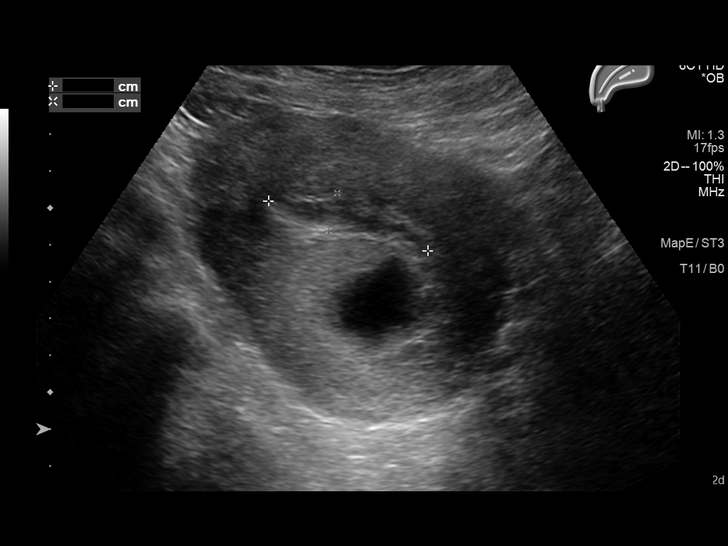
[im 15/31]
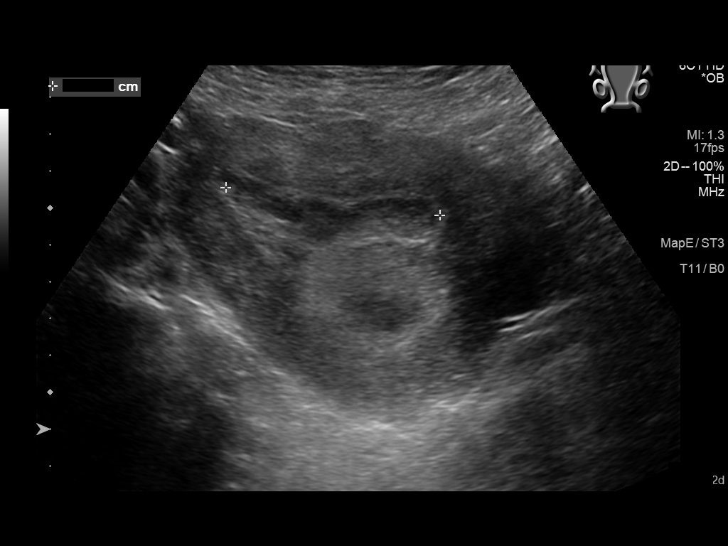
[im 17/31]
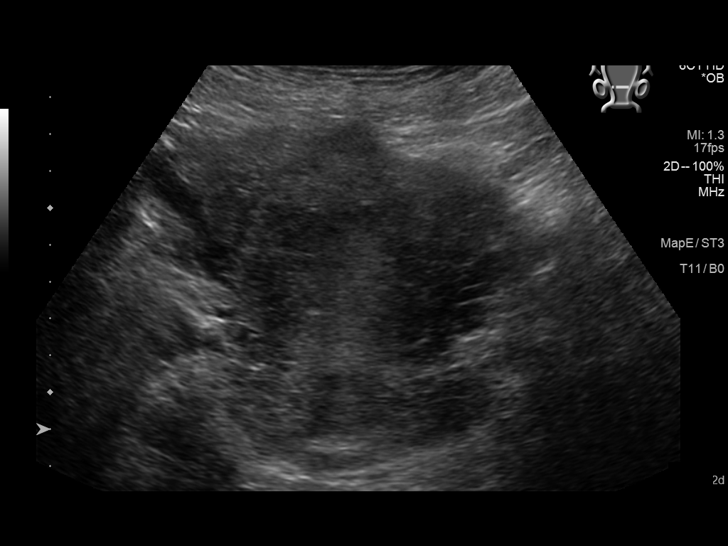
[im 19/31]
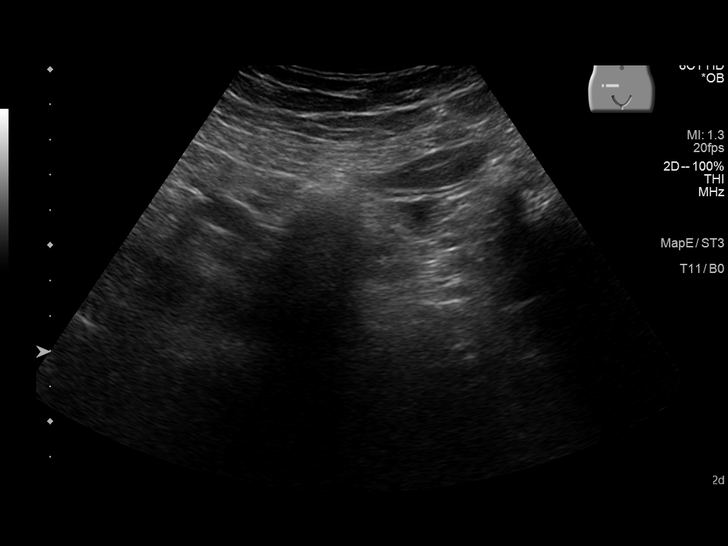
[im 22/31]
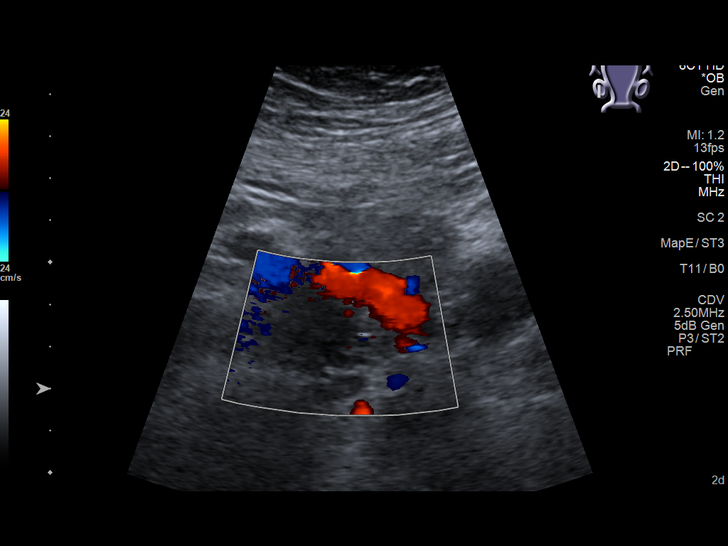
[im 24/31]
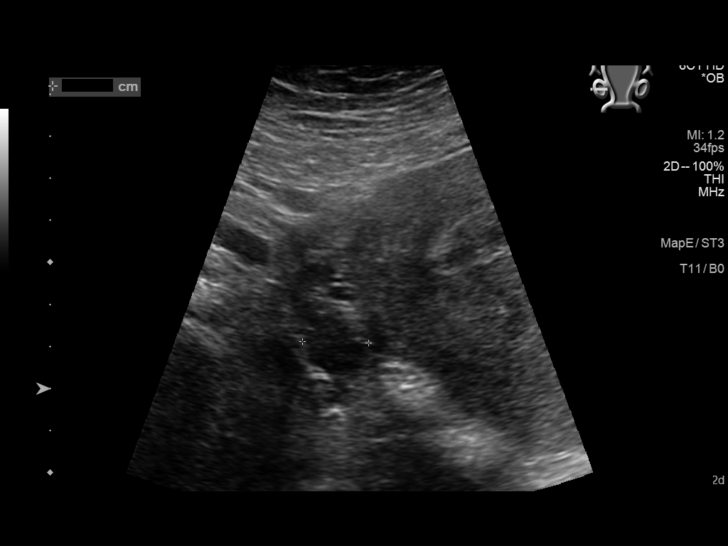
[im 26/31]
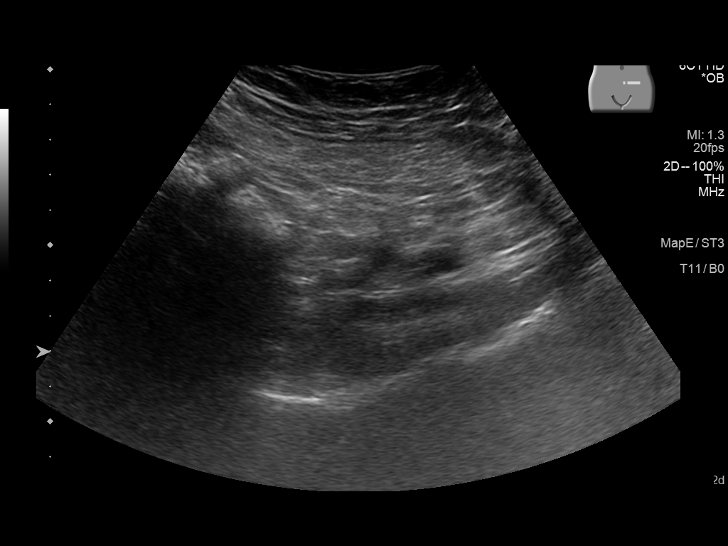
[im 28/31]
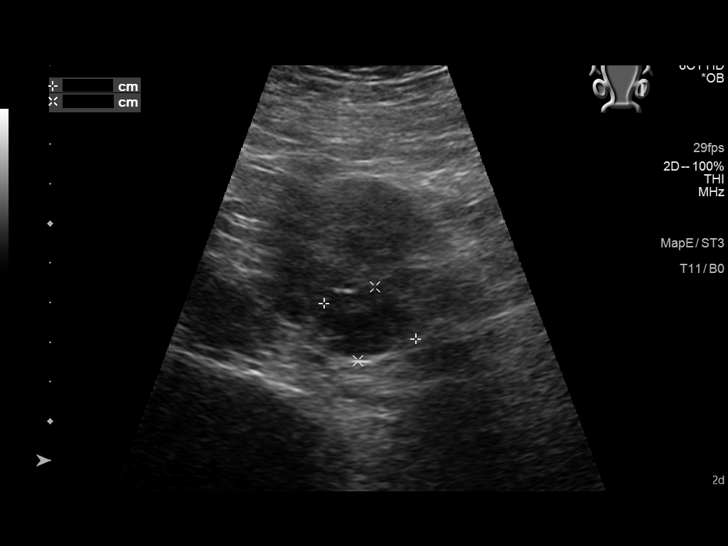
[im 31/31]
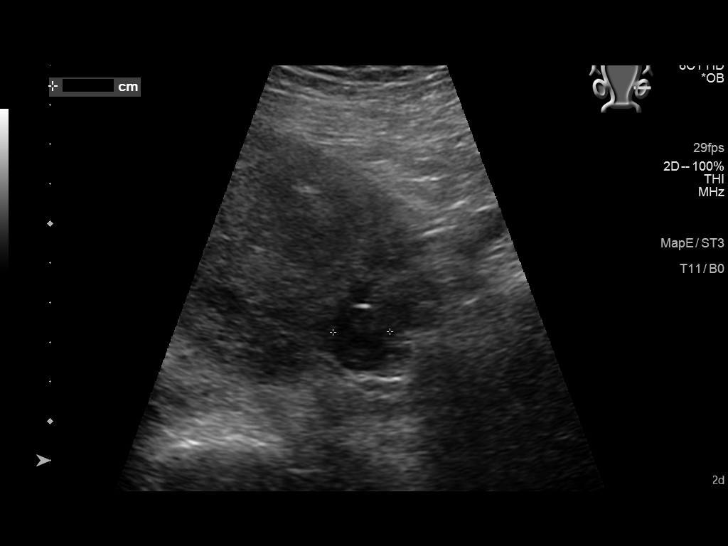

[14 of 28 positions shown; findings below may reference images not displayed]

FINDINGS: Intrauterine gestational sac: Single

Yolk sac:  Visualized.

Embryo:  Visualized.

Cardiac Activity: Visualized.

Heart Rate: 165 bpm

CRL:   24.8 mm   9 w 2 d                  US EDC: 05/19/2022

Subchorionic hemorrhage: Moderate sized subchronic hemorrhage
measuring up to 5.9 cm, new from prior.

Maternal uterus/adnexae: Bilateral ovaries within normal limits. No
free fluid within the pelvis.
IMPRESSION: 1. Single live intrauterine gestation measuring 9 weeks 2 days by
crown-rump length.
2. Active heart tones at 165 BPM.
3. Appropriate interval growth from the previous ultrasound.
4. Moderate-sized subchorionic hemorrhage, new from prior.
